# Patient Record
Sex: Female | Born: 1975 | Race: White | Hispanic: No | Marital: Married | Smoking: Current every day smoker
Health system: Southern US, Community
[De-identification: ages and names within clinical notes are randomized; demographics above are authoritative.]

## PROBLEM LIST (undated history)

## (undated) HISTORY — PX: CARDIAC SURGERY: SHX584

---

## 1997-10-07 ENCOUNTER — Other Ambulatory Visit: Admission: RE | Admit: 1997-10-07 | Discharge: 1997-10-07 | Payer: Self-pay | Admitting: *Deleted

## 2005-11-05 ENCOUNTER — Emergency Department (HOSPITAL_COMMUNITY): Admission: EM | Admit: 2005-11-05 | Discharge: 2005-11-05 | Payer: Self-pay | Admitting: Emergency Medicine

## 2009-10-19 ENCOUNTER — Ambulatory Visit (HOSPITAL_COMMUNITY): Admission: RE | Admit: 2009-10-19 | Discharge: 2009-10-19 | Payer: Self-pay | Admitting: Obstetrics and Gynecology

## 2015-05-20 ENCOUNTER — Emergency Department (HOSPITAL_COMMUNITY)
Admission: EM | Admit: 2015-05-20 | Discharge: 2015-05-20 | Disposition: A | Discharge status: Home / Self Care | Payer: Medicaid Other | Attending: Emergency Medicine | Admitting: Emergency Medicine

## 2015-05-20 ENCOUNTER — Encounter (HOSPITAL_COMMUNITY): Payer: Self-pay | Admitting: Emergency Medicine

## 2015-05-20 DIAGNOSIS — N764 Abscess of vulva: Secondary | ICD-10-CM | POA: Insufficient documentation

## 2015-05-20 DIAGNOSIS — Z720 Tobacco use: Secondary | ICD-10-CM | POA: Diagnosis not present

## 2015-05-20 MED ORDER — LIDOCAINE HCL 2 % EX GEL
1.0000 "application " | Freq: Once | CUTANEOUS | Status: AC
  Administered 2015-05-20: 1 via TOPICAL
  Filled 2015-05-20: qty 20

## 2015-05-20 MED ORDER — OXYCODONE-ACETAMINOPHEN 5-325 MG PO TABS
1.0000 | ORAL_TABLET | Freq: Once | ORAL | Status: AC
  Administered 2015-05-20: 1 via ORAL
  Filled 2015-05-20: qty 1

## 2015-05-20 MED ORDER — HYDROCODONE-ACETAMINOPHEN 5-325 MG PO TABS: 1.0000 | ORAL_TABLET | Freq: Four times a day (QID) | ORAL | Status: AC | PRN

## 2015-05-20 MED ORDER — SULFAMETHOXAZOLE-TRIMETHOPRIM 800-160 MG PO TABS: 1.0000 | ORAL_TABLET | Freq: Two times a day (BID) | ORAL | Status: AC

## 2015-05-20 MED ORDER — LIDOCAINE HCL (PF) 1 % IJ SOLN
5.0000 mL | Freq: Once | INTRAMUSCULAR | Status: AC
  Administered 2015-05-20: 5 mL
  Filled 2015-05-20: qty 5

## 2015-05-20 NOTE — Discharge Instructions (Signed)
Do not drive while taking the narcotic as it will make you sleepy. Continue to take the antibiotic in addition to the medication we give you. You may also take ibuprofen.

## 2015-05-20 NOTE — ED Provider Notes (Signed)
CSN: [id number]     Arrival date & time 05/20/15  1903 History  By signing my name below, I, [facility] Washington, attest that this documentation has been prepared under the direction and in the presence of Kerrie BuffaloHope Neese, NP. Electronically Signed: Randell PatientMarrissa Washington, ED Scribe. 05/20/2015. 8:30 PM.    Chief Complaint  Patient presents with  . Abscess    The history is provided by the patient. No language interpreter was used.   HPI Comments: Julie Soto is a 39 y.o. female who presents to the Emergency Department complaining of gradually worsening, redness and swelling of the right labia that began 3 days ago. She states [facility] who I&Ded the area, placed packing and prescribed keflex. She returned to [facility] today to have the packing removed but was told the packing was absent. Patient is concerned because she did not remove packing and she does not recall it falling out. She also notes no improvement in swelling or pain of abscess after I&D. She denies fever.    History reviewed. No pertinent past medical history. Past Surgical History  Procedure Laterality Date  . Cardiac surgery     No family history on file. Social History  Substance Use Topics  . Smoking status: Current Every Day Smoker  . Smokeless tobacco: None  . Alcohol Use: No   OB History    No data available     Review of Systems  Constitutional: Negative for fever.  Genitourinary: Positive for vaginal pain.  All other systems reviewed and are negative.     Allergies  Tramadol  Home Medications   Prior to Admission medications   Medication Sig Start Date End Date Taking? Authorizing Provider  HYDROcodone-acetaminophen (NORCO) 5-325 MG tablet Take 1 tablet by mouth every 6 (six) hours as needed. 05/20/15   Hope Orlene OchM Neese, NP  sulfamethoxazole-trimethoprim (BACTRIM DS,SEPTRA DS) 800-160 MG tablet Take 1 tablet by mouth 2 (two) times daily. 05/20/15 05/27/15  Hope  Orlene OchM Neese, NP   BP 96/48 mmHg  Pulse 99  Temp(Src) 98.4 F (36.9 C) (Oral)  Resp 16  Ht 5\' 1"  (1.549 m)  Wt 109 lb 3.2 oz (49.533 kg)  BMI 20.64 kg/m2  SpO2 100%  LMP 04/29/2015 Physical Exam  Constitutional: She is oriented to person, place, and time. She appears well-developed and well-nourished.  HENT:  Head: Normocephalic and atraumatic.  Eyes: EOM are normal.  Neck: Neck supple.  Cardiovascular: Normal rate and regular rhythm.   Pulmonary/Chest: Effort normal. No respiratory distress.  Abdominal: Soft. There is no tenderness.  Genitourinary:  Swollen area with erythema and tenderness to the right labia Tender right inguinal nodes are palpable  Musculoskeletal: Normal range of motion.  Neurological: She is alert and oriented to person, place, and time. No cranial nerve deficit.  Skin: Skin is warm and dry.  Nursing note and vitals reviewed.   ED Course  .Marland Kitchen.Incision and Drainage Date/Time: 05/20/2015 8:52 PM Performed by: Janne NapoleonNEESE, HOPE M Authorized by: Janne NapoleonNEESE, HOPE M Consent: Verbal consent obtained. Risks and benefits: risks, benefits and alternatives were discussed Consent given by: patient Patient understanding: patient states understanding of the procedure being performed Required items: required blood products, implants, devices, and special equipment available Patient identity confirmed: verbally with patient Type: abscess Body area: anogenital (right labia) Anesthesia: local infiltration Local anesthetic: lidocaine 1% without epinephrine Anesthetic total: 5 ml Patient sedated: no Scalpel size: 11 Needle gauge: 22 Incision type: single straight  Incision depth: dermal Complexity: simple Drainage: purulent Drainage amount: moderate Wound treatment: wound left open Patient tolerance: Patient tolerated the procedure well with no immediate complications Comments: Wound cleaned with Betadine and incision site irrigated with NSS, probed with curved hemostat to  break up loculation.       DIAGNOSTIC STUDIES: Oxygen Saturation is 100% on RA, normal by my interpretation.    COORDINATION OF CARE: 7:42 PM Discussed treatment plan with pt at bedside and pt agreed to plan.    MDM  39 y.o. female with swelling and tenderness to the right labia. Pain improved after I&D. Will add Bactrim to the Keflex she is currently taking and will treat for pain. She will use warm compresses and sitz baths. She will follow up with her PCP or return here as needed for problems.   Final diagnoses:  Labial abscess   I personally performed the services described in this documentation, which was scribed in my presence. The recorded information has been reviewed and is accurate.    Chaires, NP 05/20/15 2057  Benjiman Core, MD 05/20/15 [phone]

## 2015-05-20 NOTE — ED Notes (Signed)
C/o vaginal abscess that was the size of a golf ball x 3 days.  [facility].  States she is unable to see the packing that was placed and c/o increased pain.

## 2016-12-28 ENCOUNTER — Encounter: Payer: Self-pay | Admitting: Emergency Medicine

## 2016-12-28 ENCOUNTER — Emergency Department: Payer: Medicaid Other

## 2016-12-28 DIAGNOSIS — R079 Chest pain, unspecified: Secondary | ICD-10-CM | POA: Diagnosis present

## 2016-12-28 DIAGNOSIS — Z5321 Procedure and treatment not carried out due to patient leaving prior to being seen by health care provider: Secondary | ICD-10-CM | POA: Diagnosis not present

## 2016-12-28 LAB — BASIC METABOLIC PANEL
Anion gap: 9 (ref 5–15)
BUN: 13 mg/dL (ref 6–20)
CHLORIDE: 104 mmol/L (ref 101–111)
CO2: 23 mmol/L (ref 22–32)
CREATININE: 0.84 mg/dL (ref 0.44–1.00)
Calcium: 8.7 mg/dL — ABNORMAL LOW (ref 8.9–10.3)
GFR calc non Af Amer: >60 mL/min (ref >60)
Glucose, Bld: 184 mg/dL — ABNORMAL HIGH (ref 65–99)
POTASSIUM: 3.2 mmol/L — AB (ref 3.5–5.1)
Sodium: 136 mmol/L (ref 135–145)

## 2016-12-28 LAB — CBC
HEMATOCRIT: 41.1 % (ref 35.0–47.0)
HEMOGLOBIN: 14.4 g/dL (ref 12.0–16.0)
MCH: 28.8 pg (ref 26.0–34.0)
MCHC: 35 g/dL (ref 32.0–36.0)
MCV: 82.3 fL (ref 80.0–100.0)
PLATELETS: 290 10*3/uL (ref 150–440)
RBC: 4.99 MIL/uL (ref 3.80–5.20)
RDW: 13 % (ref 11.5–14.5)
WBC: 14.8 10*3/uL — AB (ref 3.6–11.0)

## 2016-12-28 LAB — TROPONIN I: Troponin I: <0.03 ng/mL (ref <0.03)

## 2016-12-28 NOTE — ED Triage Notes (Signed)
Pt states that she woke up last night around 0100 with chest pain that starts on the right side and radiates around her back. Pt denies N/V but reports waking up and being clammy feeling. Pt also states that she "thinks I have MS". Pt is in NAD at this time in triage.

## 2016-12-29 ENCOUNTER — Emergency Department
Admission: EM | Admit: 2016-12-29 | Discharge: 2016-12-29 | Disposition: A | Discharge status: Home / Self Care | Payer: Medicaid Other | Attending: Emergency Medicine | Admitting: Emergency Medicine

## 2017-11-13 IMAGING — CR DG CHEST 2V
2 series · 2 of 2 positions shown · non-contrast
Comparison: None.

CLINICAL DATA: Pt states that she woke up last night around 1111
with chest pain that starts on the right side and radiates around
her back. Pt denies N/V but reports waking up and being clammy
feeling

EXAM:
CHEST  2 VIEW

[chest pa]
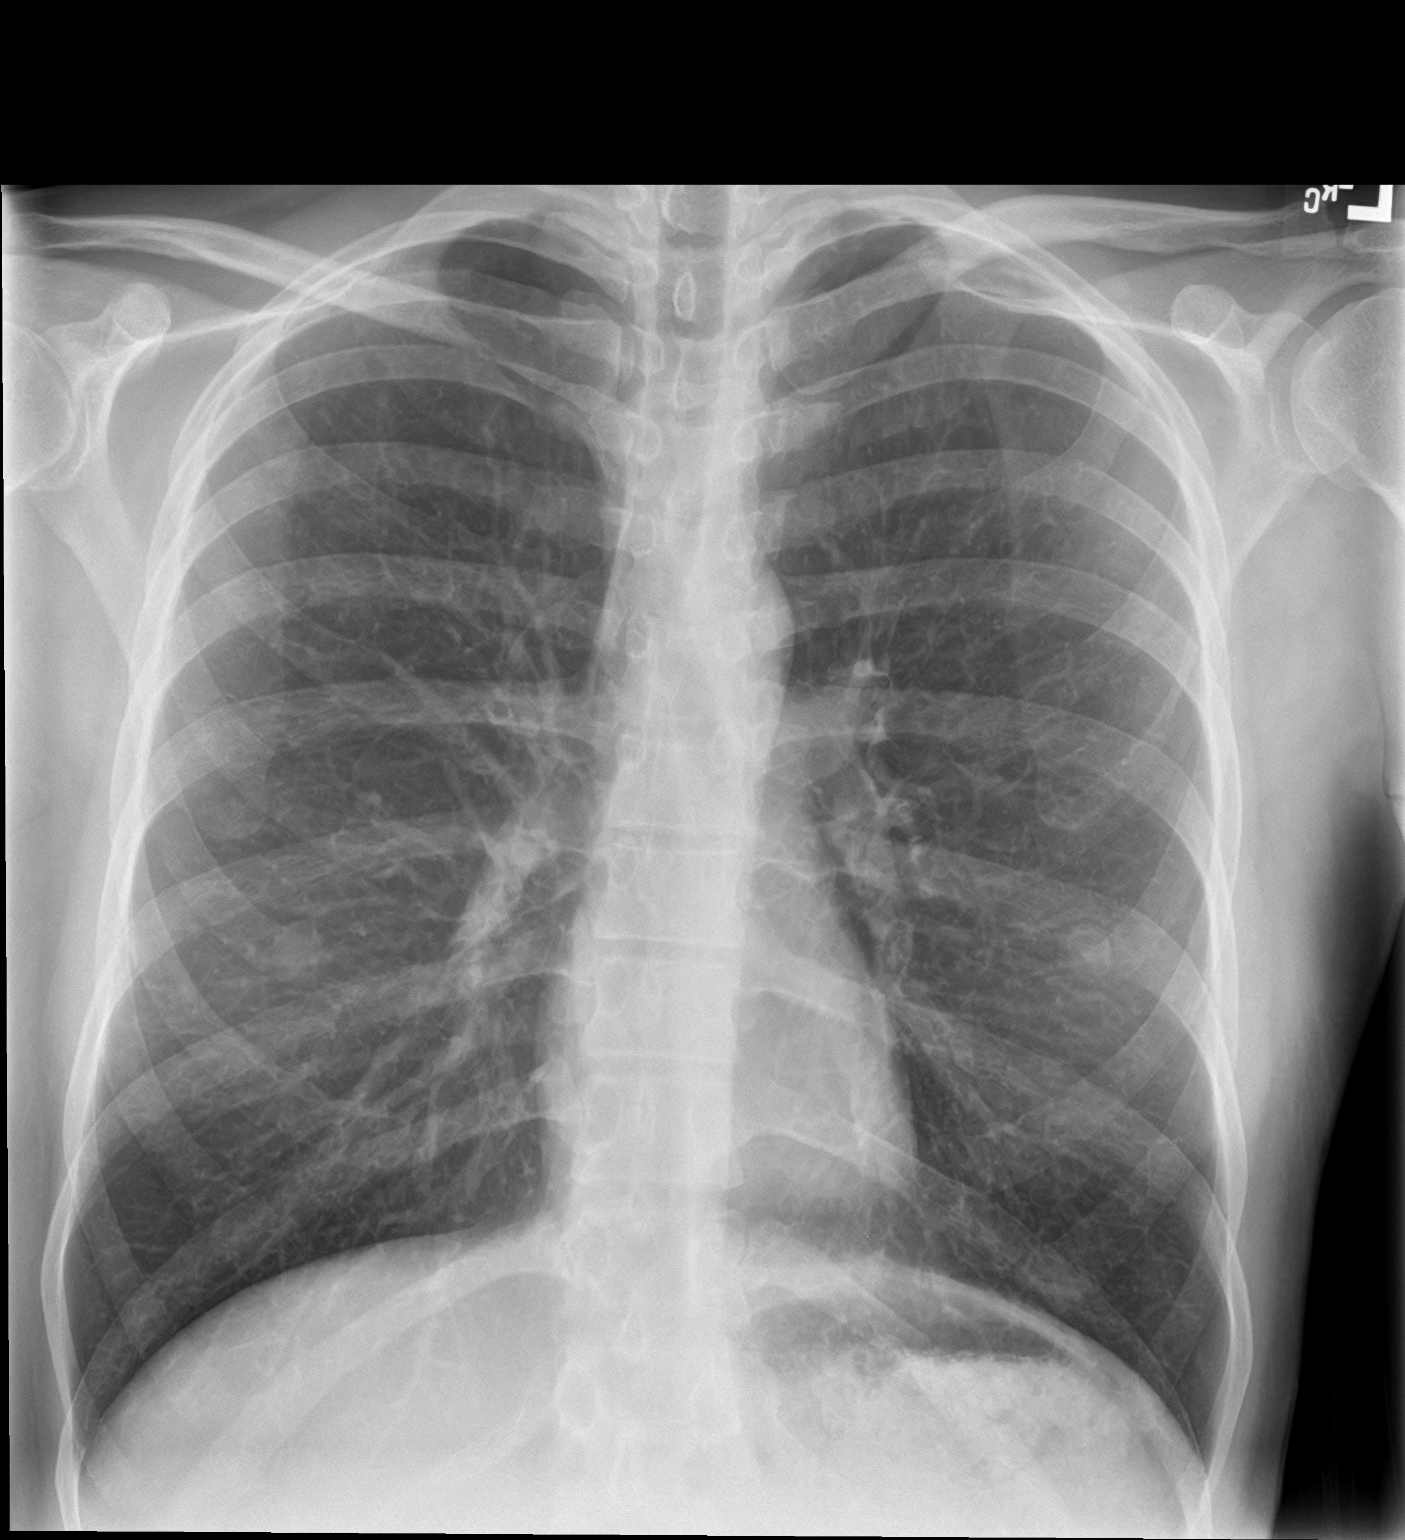

[chest lat]
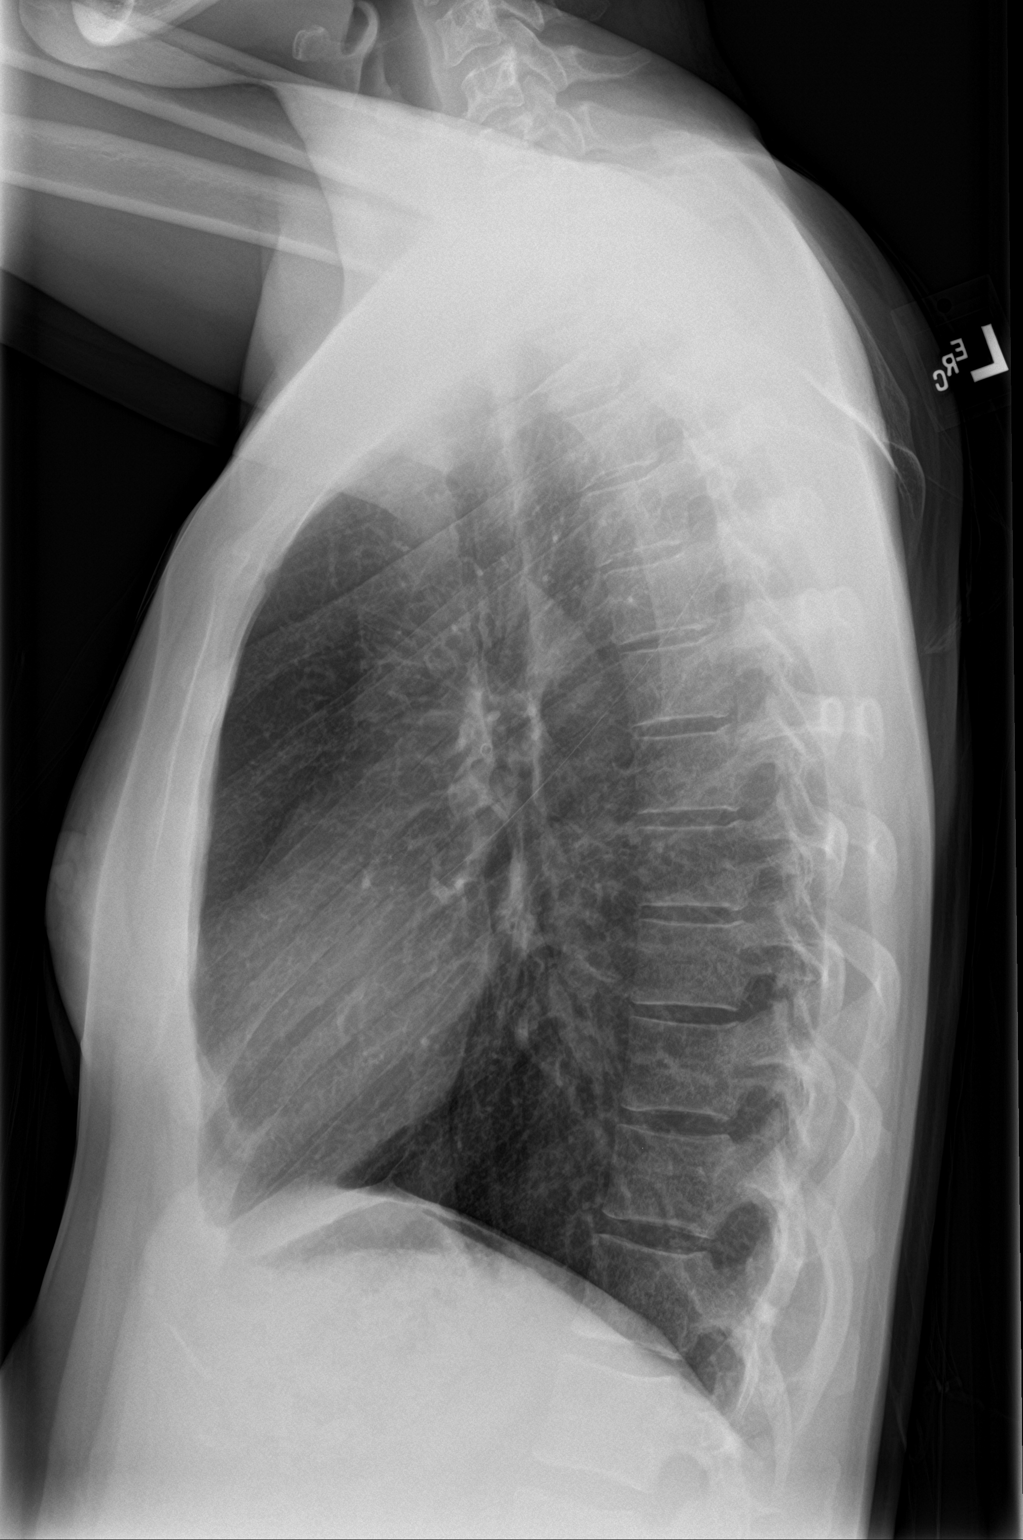

[2 of 2 positions shown; findings below may reference images not displayed]

FINDINGS: The heart size and mediastinal contours are within normal limits.
Both lungs are clear. No pleural effusion or pneumothorax. The
visualized skeletal structures are unremarkable.
IMPRESSION: No active cardiopulmonary disease.
